# Patient Record
Sex: Female | Born: 2000 | Race: White | Hispanic: No | Marital: Single | Smoking: Never smoker
Health system: Southern US, Community
[De-identification: ages and names within clinical notes are randomized; demographics above are authoritative.]

## PROBLEM LIST (undated history)

## (undated) ENCOUNTER — Ambulatory Visit: Payer: BC Managed Care – PPO | Source: Home / Self Care

## (undated) HISTORY — PX: HERNIA REPAIR: SHX51

## (undated) HISTORY — PX: WISDOM TOOTH EXTRACTION: SHX21

---

## 2021-01-04 ENCOUNTER — Emergency Department (HOSPITAL_COMMUNITY): Payer: BC Managed Care – PPO

## 2021-01-04 ENCOUNTER — Emergency Department (HOSPITAL_COMMUNITY)
Admission: EM | Admit: 2021-01-04 | Discharge: 2021-01-04 | Disposition: A | Discharge status: Home / Self Care | Payer: BC Managed Care – PPO | Attending: Emergency Medicine | Admitting: Emergency Medicine

## 2021-01-04 ENCOUNTER — Encounter (HOSPITAL_COMMUNITY): Payer: Self-pay

## 2021-01-04 DIAGNOSIS — M79642 Pain in left hand: Secondary | ICD-10-CM | POA: Diagnosis present

## 2021-01-04 DIAGNOSIS — R21 Rash and other nonspecific skin eruption: Secondary | ICD-10-CM | POA: Insufficient documentation

## 2021-01-04 MED ORDER — DOXYCYCLINE HYCLATE 100 MG PO CAPS: 100.0000 mg | ORAL_CAPSULE | Freq: Two times a day (BID) | ORAL | 0 refills | Status: DC

## 2021-01-04 NOTE — ED Provider Notes (Signed)
[facility]-EMERGENCY DEPT Provider Note   CSN: [id number] Arrival date & time: 01/04/21  1203     History Chief Complaint  Patient presents with   Hand Pain    left    Tamara Cunningham is a 20 y.o. female.  HPI  Patient presents with left hand pain.  States it started acutely when she woke up, there is also an associated rash overlying the PIP joint. States that the rash started spreading moved up her finger. States she noticed a slight bump over the PIP joint last night that was nontender and without any associated rash.  Denies any discharge, no trauma to the hand.  No recent antibiotic use, no history of bug bite.  Patient has not tried any alleviating factors, time seems to be an aggravating factor.  History reviewed. No pertinent past medical history.  There are no problems to display for this patient.   History reviewed. No pertinent surgical history.   OB History   No obstetric history on file.     No family history on file.     Home Medications Prior to Admission medications   Not on File    Allergies    Patient has no allergy information on record.  Review of Systems   Review of Systems  Constitutional:  Negative for fever.  Musculoskeletal:  Positive for arthralgias and myalgias.  Skin:  Positive for color change and rash.   Physical Exam Updated Vital Signs BP 120/67 (BP Location: Right Arm)   Pulse 70   Temp 98.4 F (36.9 C) (Oral)   Resp 14   Ht 5\' 3"  (1.6 m)   Wt 61.2 kg   LMP 12/14/2020 (Approximate)   SpO2 100%   BMI 23.91 kg/m   Physical Exam Vitals and nursing note reviewed. Exam conducted with a chaperone present.  Constitutional:      General: She is not in acute distress.    Appearance: Normal appearance.  HENT:     Head: Normocephalic and atraumatic.  Eyes:     General: No scleral icterus.    Extraocular Movements: Extraocular movements intact.     Pupils: Pupils are equal, round, and reactive  to light.  Cardiovascular:     Comments: Radial pulse 2+ Musculoskeletal:        General: Tenderness present. Normal range of motion.     Comments: Can tolerate full range of motion to each digit passively and actively.  Patient is tenderness over the palmar area of her left hand.  Worse at the PIP joint of the third digit.  Skin:    Capillary Refill: Capillary refill takes less than 2 seconds.     Coloration: Skin is not jaundiced.     Findings: Rash present.  Neurological:     Mental Status: She is alert. Mental status is at baseline.     Coordination: Coordination normal.     ED Results / Procedures / Treatments   Labs (all labs ordered are listed, but only abnormal results are displayed) Labs Reviewed - No data to display  EKG None  Radiology No results found.  Procedures Procedures   Medications Ordered in ED Medications - No data to display  ED Course  I have reviewed the triage vital signs and the nursing notes.  Pertinent labs & imaging results that were available during my care of the patient were reviewed by me and considered in my medical decision making (see chart for details).    MDM Rules/Calculators/A&P  Patient vitals are stable, no fever.  Physical exam is notable for tenderness, some swelling, erythema on the palmar side.  She does have range of motion is able to flex and extend at the digit.  Doubt a septic joint.  Considered possibility of it being an early flexor tenosynovitis patient location, but given initially been ongoing for 1 day and she has good range of motion I suspect this more likely to be a cellulitis.  Radiograph was ordered to assess for any underlying injury and that came back negative for any fractures dislocations or bony involvement.  We will start with a trial of doxycycline twice daily for the next 10 days.  Patient can follow-up was given, strict her precautions were given as well.  Discussed HPI,  physical exam and plan of care for this patient with attending Meridee Score. The attending physician evaluated this patient as part of a shared visit and agrees with plan of care.   Final Clinical Impression(s) / ED Diagnoses Final diagnoses:  None    Rx / DC Orders ED Discharge Orders     None        Theron Arista, Cordelia Poche 01/04/21 1439    Terrilee Files, MD 01/04/21 [phone]

## 2021-01-04 NOTE — Discharge Instructions (Signed)
Take doxycycline twice daily for the next 10 days.  If the swelling spreads or if the pain worsens please come back to the ED as we discussed.  Please follow-up with hand surgery as needed.

## 2021-01-04 NOTE — ED Provider Notes (Signed)
Emergency Medicine Provider Triage Evaluation Note  Tamara Cunningham , a 20 y.o. female  was evaluated in triage.  Pt complains of left hand pain and swelling that started last night. Denies fevers or trauma.  Review of Systems  Positive: Hand pain Negative: fever  Physical Exam  BP 120/67 (BP Location: Right Arm)   Pulse 70   Temp 98.4 F (36.9 C) (Oral)   Resp 14  Gen:   Awake, no distress   Resp:  Normal effort  MSK:   Moves extremities without difficulty  Other:  Erythema warmth and ttp to the palmar aspect of the left hand  Medical Decision Making  Medically screening exam initiated at 12:28 PM.  Appropriate orders placed.  Tamara Cunningham was informed that the remainder of the evaluation will be completed by another provider, this initial triage assessment does not replace that evaluation, and the importance of remaining in the ED until their evaluation is complete.     [name] 01/04/21 1322    Glendora Score, MD 01/04/21 1754

## 2021-01-04 NOTE — ED Triage Notes (Signed)
Pt reports hand pain on left palm of hand that started last night and starting to spread. Redness noted.  6/10 hand pain.   A/Ox4 Ambulatory in triage.

## 2021-12-01 ENCOUNTER — Ambulatory Visit (INDEPENDENT_AMBULATORY_CARE_PROVIDER_SITE_OTHER): Payer: BC Managed Care – PPO | Admitting: Radiology

## 2021-12-01 ENCOUNTER — Encounter: Payer: Self-pay | Admitting: Radiology

## 2021-12-01 VITALS — BP 120/80 | Ht 62.5 in | Wt 152.0 lb

## 2021-12-01 DIAGNOSIS — N920 Excessive and frequent menstruation with regular cycle: Secondary | ICD-10-CM | POA: Diagnosis not present

## 2021-12-01 DIAGNOSIS — Z01419 Encounter for gynecological examination (general) (routine) without abnormal findings: Secondary | ICD-10-CM | POA: Diagnosis not present

## 2021-12-01 MED ORDER — TRANEXAMIC ACID 650 MG PO TABS: 1300.0000 mg | ORAL_TABLET | Freq: Three times a day (TID) | ORAL | 2 refills | Status: DC

## 2021-12-01 NOTE — Progress Notes (Signed)
   Tamara Cunningham 2000-09-08 [id number]   History:  21 y.o. G0 presents for annual exam as a new patient. Reports heavy flow days 2-5. Has tried OCPs in the past which caused mood changes and the patch made her period very irregular. She is interested in a non hormonal way to treat her symptoms.  Gynecologic History Patient's last menstrual period was 11/10/2021 (approximate). Period Cycle (Days): 28 Period Duration (Days): 8 Period Pattern: Regular Menstrual Flow: Heavy Menstrual Control: Maxi pad Dysmenorrhea: (!) Severe Dysmenorrhea Symptoms: Cramping, Other (Comment) (lower pelvic and lower back cramping/pain) Contraception/Family planning:  female partner Sexually active: yes Last Pap: n/a  Obstetric History OB History  Gravida Para Term Preterm AB Living  0 0 0 0 0 0  SAB IAB Ectopic Multiple Live Births  0 0 0 0 0     The following portions of the patient's history were reviewed and updated as appropriate: allergies, current medications, past family history, past medical history, past social history, past surgical history, and problem list.  Review of Systems Pertinent items noted in HPI and remainder of comprehensive ROS otherwise negative.   Past medical history, past surgical history, family history and social history were all reviewed and documented in the EPIC chart.   Exam:  Vitals:   12/01/21 0838  BP: 120/80  Weight: 152 lb (68.9 kg)  Height: 5' 2.5" (1.588 m)   Body mass index is 27.36 kg/m.  General appearance:  Normal Thyroid:  Symmetrical, normal in size, without palpable masses or nodularity. Respiratory  Auscultation:  Clear without wheezing or rhonchi Cardiovascular  Auscultation:  Regular rate, without rubs, murmurs or gallops  Edema/varicosities:  Not grossly evident Abdominal  Soft,nontender, without masses, guarding or rebound.  Liver/spleen:  No organomegaly noted  Hernia:  None appreciated  Skin  Inspection:  Grossly  normal Breasts: Examined lying and sitting.   Right: Without masses, retractions, nipple discharge or axillary adenopathy.   Left: Without masses, retractions, nipple discharge or axillary adenopathy. Genitourinary   Inguinal/mons:  Normal without inguinal adenopathy  External genitalia:  Normal appearing vulva with no masses, tenderness, or lesions  BUS/Urethra/Skene's glands:  Normal without masses or exudate  Vagina:  Normal appearing with normal color and discharge, no lesions  Cervix:  Normal appearing without discharge or lesions  Uterus:  Normal in size, shape and contour.  Mobile, nontender  Adnexa/parametria:     Rt: Normal in size, without masses or tenderness.   Lt: Normal in size, without masses or tenderness.  Anus and perineum: Normal   Patient informed chaperone available to be present for breast and pelvic exam. Patient has requested no chaperone to be present. Patient has been advised what will be completed during breast and pelvic exam.   Assessment/Plan:   1. Well woman exam with routine gynecological exam Pap at 21  2. Menorrhagia with regular cycle Declines hormonal management - tranexamic acid (LYSTEDA) 650 MG TABS tablet; Take 2 tablets (1,300 mg total) by mouth 3 (three) times daily. As needed for heavy menstrual bleeding  Dispense: 30 tablet; Refill: 2     Discussed SBE, pap and STI screening as directed/appropriate. Recommend of exercise weekly, including weight bearing exercise. Encouraged the use of seatbelts and sunscreen. Return in 1 year for annual or as needed.   Arlie Solomons B WHNP-BC 8:56 AM 12/01/2021

## 2022-12-05 ENCOUNTER — Other Ambulatory Visit (HOSPITAL_COMMUNITY)
Admission: RE | Admit: 2022-12-05 | Discharge: 2022-12-05 | Disposition: A | Discharge status: Home / Self Care | Payer: BC Managed Care – PPO | Source: Ambulatory Visit | Attending: Radiology | Admitting: Radiology

## 2022-12-05 ENCOUNTER — Ambulatory Visit (INDEPENDENT_AMBULATORY_CARE_PROVIDER_SITE_OTHER): Payer: BC Managed Care – PPO | Admitting: Radiology

## 2022-12-05 ENCOUNTER — Encounter: Payer: Self-pay | Admitting: Radiology

## 2022-12-05 VITALS — BP 122/72 | Ht 62.75 in | Wt 145.0 lb

## 2022-12-05 DIAGNOSIS — Z113 Encounter for screening for infections with a predominantly sexual mode of transmission: Secondary | ICD-10-CM | POA: Diagnosis present

## 2022-12-05 DIAGNOSIS — Z01419 Encounter for gynecological examination (general) (routine) without abnormal findings: Secondary | ICD-10-CM | POA: Diagnosis present

## 2022-12-05 DIAGNOSIS — N920 Excessive and frequent menstruation with regular cycle: Secondary | ICD-10-CM

## 2022-12-05 MED ORDER — TRANEXAMIC ACID 650 MG PO TABS: 1300.0000 mg | ORAL_TABLET | Freq: Three times a day (TID) | ORAL | 1 refills | Status: AC

## 2022-12-05 NOTE — Progress Notes (Signed)
   Tamara Cunningham 04/06/2001 [id number]   History:  22 y.o. G0 presents for annual exam. No gyn concerns, elects for STI screen with pap. Would like to try lysteda for menorrhagia. Did not pick up last year.   Gynecologic History Patient's last menstrual period was 11/07/2022 (approximate). Period Cycle (Days): 28 Period Duration (Days): 5 Period Pattern: Regular Menstrual Flow: Heavy Menstrual Control: Tampon, Maxi pad, Thin pad Dysmenorrhea: (!) Moderate Dysmenorrhea Symptoms: Cramping Contraception/Family planning:  female partner Sexually active: yes   Obstetric History OB History  Gravida Para Term Preterm AB Living  0 0 0 0 0 0  SAB IAB Ectopic Multiple Live Births  0 0 0 0 0     The following portions of the patient's history were reviewed and updated as appropriate: allergies, current medications, past family history, past medical history, past social history, past surgical history, and problem list.  Review of Systems Pertinent items noted in HPI and remainder of comprehensive ROS otherwise negative.   Past medical history, past surgical history, family history and social history were all reviewed and documented in the EPIC chart.   Exam:  Vitals:   12/05/22 0849  BP: 122/72  Weight: 145 lb (65.8 kg)  Height: 5' 2.75" (1.594 m)   Body mass index is 25.89 kg/m.  General appearance:  Normal Thyroid:  Symmetrical, normal in size, without palpable masses or nodularity. Respiratory  Auscultation:  Clear without wheezing or rhonchi Cardiovascular  Auscultation:  Regular rate, without rubs, murmurs or gallops  Edema/varicosities:  Not grossly evident Abdominal  Soft,nontender, without masses, guarding or rebound.  Liver/spleen:  No organomegaly noted  Hernia:  None appreciated  Skin  Inspection:  Grossly normal Breasts: Examined lying and sitting.   Right: Without masses, retractions, nipple discharge or axillary adenopathy.   Left: Without  masses, retractions, nipple discharge or axillary adenopathy. Genitourinary   Inguinal/mons:  Normal without inguinal adenopathy  External genitalia:  Normal appearing vulva with no masses, tenderness, or lesions  BUS/Urethra/Skene's glands:  Normal without masses or exudate  Vagina:  Normal appearing with normal color and discharge, no lesions  Cervix:  Normal appearing without discharge or lesions  Uterus:  Normal in size, shape and contour.  Mobile, nontender  Adnexa/parametria:     Rt: Normal in size, without masses or tenderness.   Lt: Normal in size, without masses or tenderness.  Anus and perineum: Normal   Raynelle Fanning, CMA present for exam  Assessment/Plan:   1. Well woman exam with routine gynecological exam - Cytology - PAP( [place])  2. Screening for STDs (sexually transmitted diseases) - Cytology - PAP( [place])   3. Menorrhagia with regular cycle - tranexamic acid (LYSTEDA) 650 MG TABS tablet; Take 2 tablets (1,300 mg total) by mouth 3 (three) times daily.  Dispense: 30 tablet; Refill: 1    Discussed SBE, pap and STI screening as directed/appropriate. Recommend of exercise weekly, including weight bearing exercise. Encouraged the use of seatbelts and sunscreen. Return in 1 year for annual or as needed.   Arlie Solomons B WHNP-BC 8:59 AM 12/05/2022

## 2022-12-06 LAB — CYTOLOGY - PAP
Chlamydia: NEGATIVE
Comment: NEGATIVE
Comment: NEGATIVE
Comment: NORMAL
Diagnosis: NEGATIVE
Neisseria Gonorrhea: NEGATIVE
Trichomonas: NEGATIVE

## 2023-04-12 ENCOUNTER — Ambulatory Visit
Admission: RE | Admit: 2023-04-12 | Discharge: 2023-04-12 | Disposition: A | Discharge status: Home / Self Care | Payer: BC Managed Care – PPO | Source: Ambulatory Visit | Attending: Physician Assistant | Admitting: Physician Assistant

## 2023-04-12 ENCOUNTER — Other Ambulatory Visit: Payer: Self-pay

## 2023-04-12 VITALS — BP 119/76 | HR 73 | Temp 98.2°F | Resp 18

## 2023-04-12 DIAGNOSIS — K649 Unspecified hemorrhoids: Secondary | ICD-10-CM | POA: Diagnosis not present

## 2023-04-12 MED ORDER — HYDROCORTISONE (PERIANAL) 2.5 % EX CREA: 1.0000 | TOPICAL_CREAM | Freq: Two times a day (BID) | CUTANEOUS | 0 refills | Status: AC

## 2023-04-12 NOTE — ED Triage Notes (Signed)
 Pt reports rectal pain since mid Nov. And in Dec. Pt has seen some blood in toilet .

## 2023-04-16 NOTE — ED Provider Notes (Signed)
 [facility]    CSN: [id number] Arrival date & time: 04/12/23  1652      History   Chief Complaint Chief Complaint  Patient presents with   Hemorrhoids    Entered by patient    HPI Tamara Cunningham is a 23 y.o. female.   Patient complains of having a hemorrhoid.  Patient is here to be evaluated to see if she does have hemorrhoid.  Patient reports that she feels something around her rectum.  Patient denies any bleeding.  Patient denies any fever chills no nausea no vomiting no diarrhea  The history is provided by the patient. No language interpreter was used.    History reviewed. No pertinent past medical history.  There are no active problems to display for this patient.   Past Surgical History:  Procedure Laterality Date   HERNIA REPAIR     age 49   WISDOM TOOTH EXTRACTION      OB History     Gravida  0   Para  0   Term  0   Preterm  0   AB  0   Living  0      SAB  0   IAB  0   Ectopic  0   Multiple  0   Live Births  0            Home Medications    Prior to Admission medications   Medication Sig Start Date End Date Taking? Authorizing Provider  hydrocortisone  (ANUSOL -HC) 2.5 % rectal cream Place 1 Application rectally 2 (two) times daily. 04/12/23  Yes Sadik Piascik K, PA-C  tranexamic acid  (LYSTEDA ) 650 MG TABS tablet Take 2 tablets (1,300 mg total) by mouth 3 (three) times daily. 12/05/22   Chrzanowski, Shasta NOVAK, NP    Family History Family History  Problem Relation Age of Onset   Diabetes Father    Lung cancer Maternal Grandfather     Social History Social History   Tobacco Use   Smoking status: Never    Passive exposure: Never   Smokeless tobacco: Never  Substance Use Topics   Alcohol use: Yes    Alcohol/week: 1.0 standard drink of alcohol    Types: 1 Standard drinks or equivalent per week   Drug use: Never     Allergies   Patient has no known allergies.   Review of Systems Review of Systems  All  other systems reviewed and are negative.    Physical Exam Triage Vital Signs ED Triage Vitals  Encounter Vitals Group     BP 04/12/23 1728 119/76     Systolic BP Percentile --      Diastolic BP Percentile --      Pulse Rate 04/12/23 1728 73     Resp 04/12/23 1728 18     Temp 04/12/23 1728 98.2 F (36.8 C)     Temp src --      SpO2 04/12/23 1728 97 %     Weight --      Height --      Head Circumference --      Peak Flow --      Pain Score 04/12/23 1725 4     Pain Loc --      Pain Education --      Exclude from Growth Chart --    No data found.  Updated Vital Signs BP 119/76   Pulse 73   Temp 98.2 F (36.8 C)   Resp 18  LMP 03/29/2023 (Approximate)   SpO2 97%   Visual Acuity Right Eye Distance:   Left Eye Distance:   Bilateral Distance:    Right Eye Near:   Left Eye Near:    Bilateral Near:     Physical Exam Vitals reviewed.  Constitutional:      Appearance: Normal appearance.  Cardiovascular:     Rate and Rhythm: Normal rate.  Pulmonary:     Effort: Pulmonary effort is normal.  Genitourinary:    Comments: Small external hemorrhoid versus small skin fold. Musculoskeletal:        General: Normal range of motion.  Skin:    General: Skin is warm.  Neurological:     General: No focal deficit present.     Mental Status: She is alert.      UC Treatments / Results  Labs (all labs ordered are listed, but only abnormal results are displayed) Labs Reviewed - No data to display  EKG   Radiology No results found.  Procedures Procedures (including critical care time)  Medications Ordered in UC Medications - No data to display  Initial Impression / Assessment and Plan / UC Course  I have reviewed the triage vital signs and the nursing notes.  Pertinent labs & imaging results that were available during my care of the patient were reviewed by me and considered in my medical decision making (see chart for details).     Suspect patient has a  small hemorrhoid.  Patient is counseled on hemorrhoids Final Clinical Impressions(s) / UC Diagnoses   Final diagnoses:  Hemorrhoids, unspecified hemorrhoid type   Discharge Instructions   None    ED Prescriptions     Medication Sig Dispense Auth. Provider   hydrocortisone  (ANUSOL -HC) 2.5 % rectal cream Place 1 Application rectally 2 (two) times daily. 30 g Lamere Lightner K, PA-C      PDMP not reviewed this encounter. An After Visit Summary was printed and given to the patient.       Flint Sonny POUR, PA-C 04/16/23 2321

## 2023-08-19 IMAGING — CR DG HAND COMPLETE 3+V*L*
3 series · 3 of 3 positions shown · non-contrast
Comparison: None.

CLINICAL DATA: Pain, no known injury

EXAM:
LEFT HAND - COMPLETE 3+ VIEW

[x hand pa left]
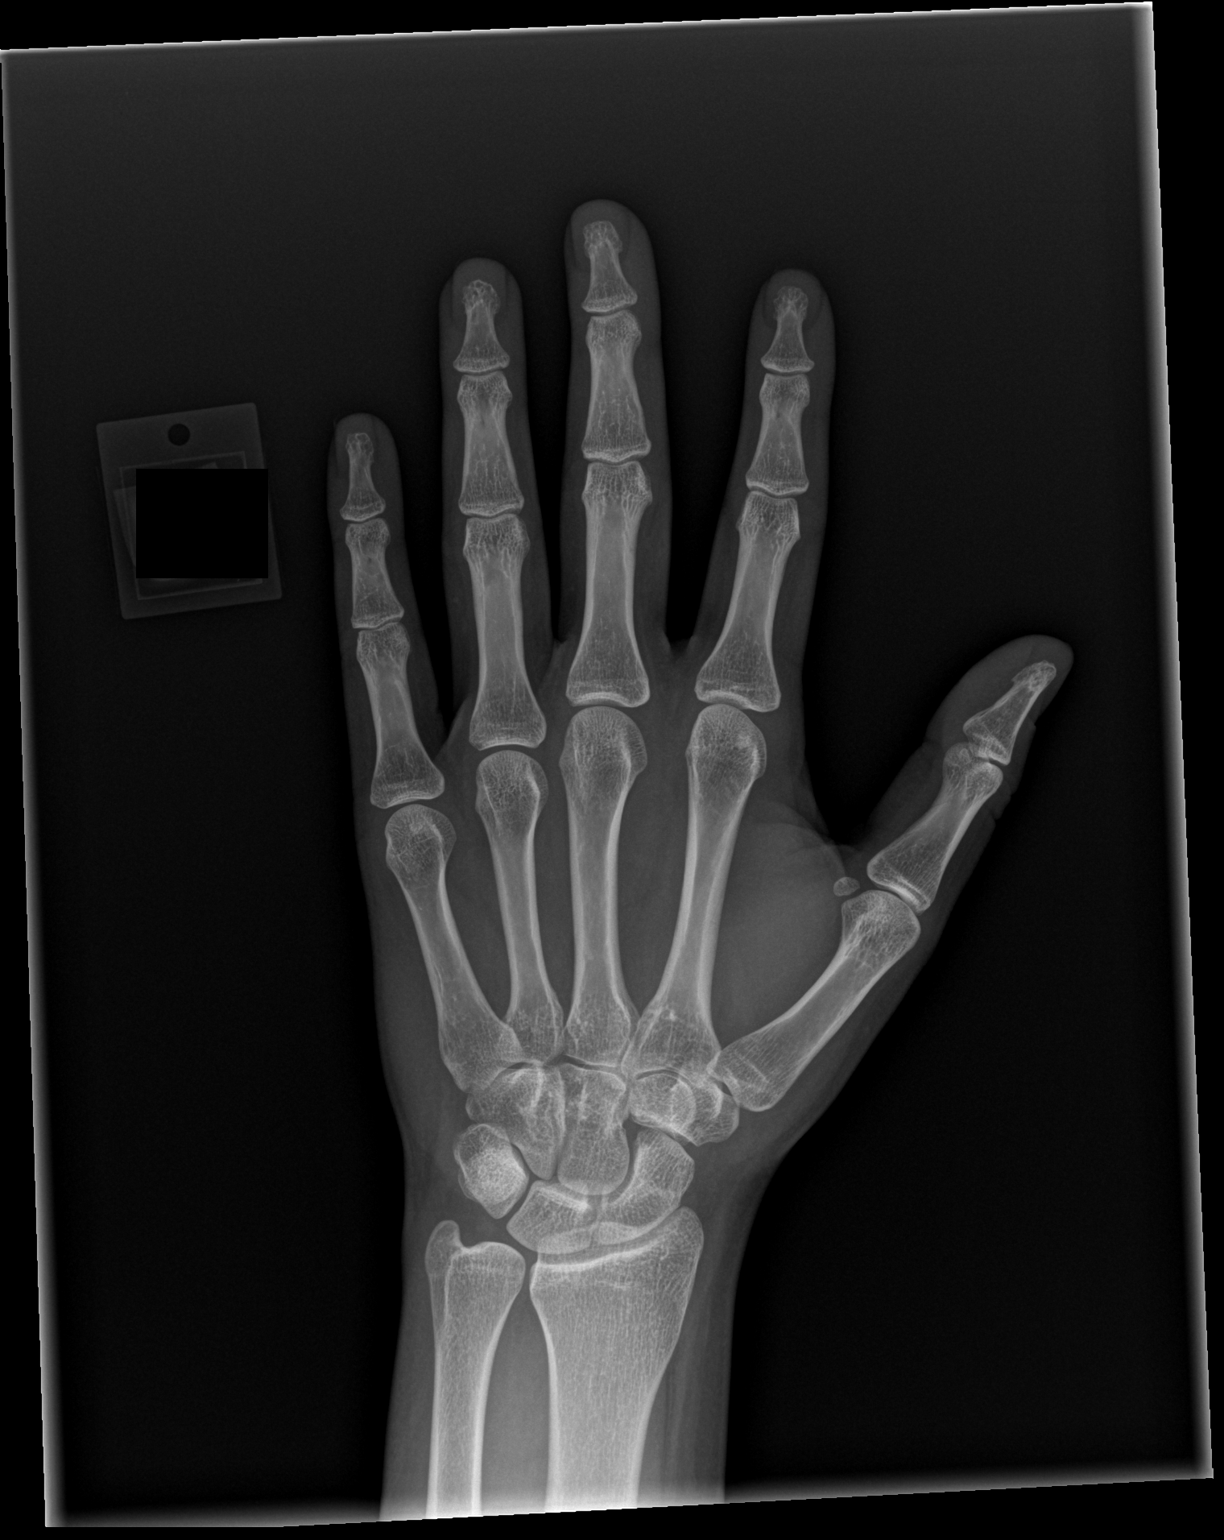

[x hand obl left]
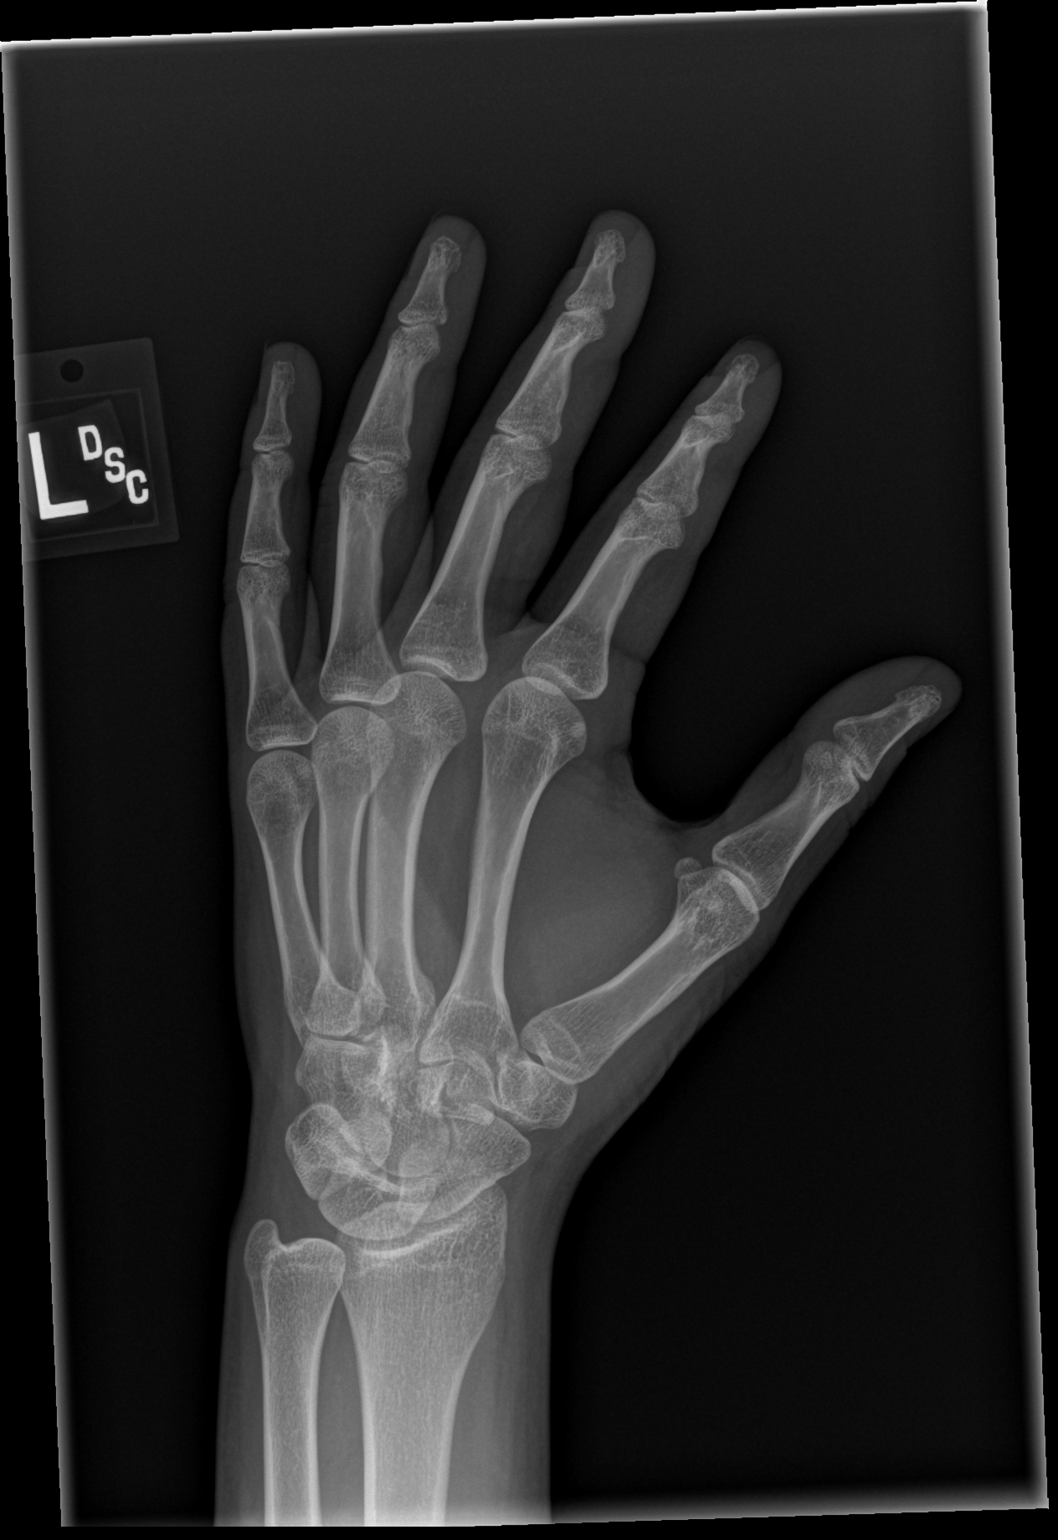

[x hand lat left]
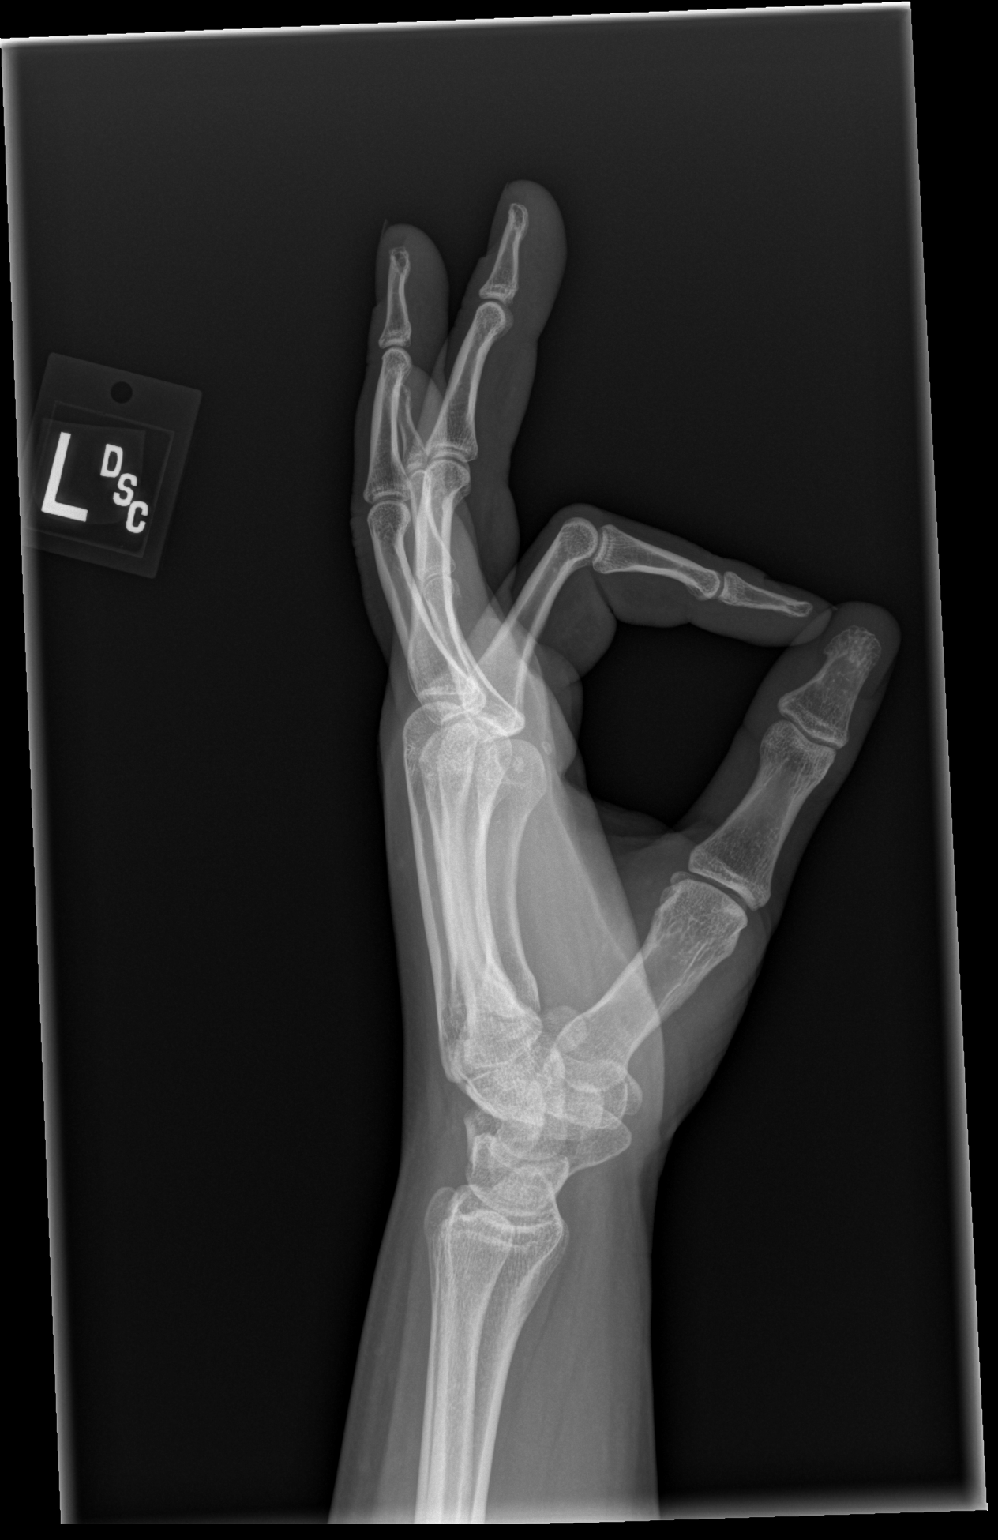

[3 of 3 positions shown; findings below may reference images not displayed]

FINDINGS: There is no evidence of fracture or dislocation. There is no
evidence of arthropathy or other focal bone abnormality. Soft
tissues are unremarkable.
IMPRESSION: No fracture or dislocation of the left hand.

## 2023-12-07 ENCOUNTER — Ambulatory Visit: Payer: BC Managed Care – PPO | Admitting: Radiology
